# Patient Record
Sex: Male | Born: 1980 | Race: Asian | Hispanic: No | Marital: Single | Smoking: Never smoker
Health system: Southern US, Community
[De-identification: ages and names within clinical notes are randomized; demographics above are authoritative.]

---

## 2008-01-10 ENCOUNTER — Emergency Department (HOSPITAL_COMMUNITY): Admission: EM | Admit: 2008-01-10 | Discharge: 2008-01-10 | Payer: Self-pay | Admitting: Family Medicine

## 2012-07-07 ENCOUNTER — Ambulatory Visit: Payer: Self-pay | Admitting: Medical

## 2012-09-30 ENCOUNTER — Ambulatory Visit (INDEPENDENT_AMBULATORY_CARE_PROVIDER_SITE_OTHER): Payer: BC Managed Care – HMO | Admitting: Family Medicine

## 2012-09-30 VITALS — BP 112/82 | HR 72 | Ht 63.2 in | Wt 152.0 lb

## 2012-09-30 DIAGNOSIS — M25562 Pain in left knee: Secondary | ICD-10-CM

## 2012-09-30 DIAGNOSIS — M25569 Pain in unspecified knee: Secondary | ICD-10-CM

## 2012-09-30 NOTE — Patient Instructions (Signed)
Take 2 Aleve twice a day for the next 2 weeks. If the pain goes away we don't need to do anything else. If it continues then I want you to come back for reevaluation

## 2012-09-30 NOTE — Progress Notes (Signed)
  Subjective:    Patient ID: Edward Hatfield, male    DOB: 1980-11-05, 32 y.o.   MRN: [id number]  HPI 2 weeks ago he noted the onset of left knee pain and inability to fully flex his knee. No popping, swelling, locking or grinding. No history of injury. No other joints are involved.  Review of Systems     Objective:   Physical Exam Exam of the left knee shows no palpable tenderness, effusion. Full motion. No joint line tenderness. Negative anterior drawer. McMurray's testing medial cause slight discomfort.       Assessment & Plan:  Left knee pain  recommend conservative care with 2 Aleve twice per day for the next several weeks. If he has no relief of the symptoms, history turned here. He voiced understanding.

## 2015-07-18 ENCOUNTER — Emergency Department (HOSPITAL_COMMUNITY)
Admission: EM | Admit: 2015-07-18 | Discharge: 2015-07-19 | Disposition: A | Discharge status: Home / Self Care | Payer: 59 | Attending: Emergency Medicine | Admitting: Emergency Medicine

## 2015-07-18 ENCOUNTER — Encounter (HOSPITAL_COMMUNITY): Payer: Self-pay | Admitting: Emergency Medicine

## 2015-07-18 DIAGNOSIS — Y999 Unspecified external cause status: Secondary | ICD-10-CM | POA: Insufficient documentation

## 2015-07-18 DIAGNOSIS — S161XXA Strain of muscle, fascia and tendon at neck level, initial encounter: Secondary | ICD-10-CM | POA: Insufficient documentation

## 2015-07-18 DIAGNOSIS — Y9389 Activity, other specified: Secondary | ICD-10-CM | POA: Diagnosis not present

## 2015-07-18 DIAGNOSIS — Y92410 Unspecified street and highway as the place of occurrence of the external cause: Secondary | ICD-10-CM | POA: Diagnosis not present

## 2015-07-18 DIAGNOSIS — S20219A Contusion of unspecified front wall of thorax, initial encounter: Secondary | ICD-10-CM | POA: Insufficient documentation

## 2015-07-18 DIAGNOSIS — S199XXA Unspecified injury of neck, initial encounter: Secondary | ICD-10-CM | POA: Diagnosis present
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
## 2015-07-19 ENCOUNTER — Emergency Department (HOSPITAL_COMMUNITY): Payer: 59

## 2015-07-19 DIAGNOSIS — S161XXA Strain of muscle, fascia and tendon at neck level, initial encounter: Secondary | ICD-10-CM | POA: Diagnosis not present

## 2015-07-19 MED ORDER — CYCLOBENZAPRINE HCL 10 MG PO TABS: 10.0000 mg | ORAL_TABLET | Freq: Three times a day (TID) | ORAL | Status: AC | PRN

## 2015-07-19 MED ORDER — IBUPROFEN 800 MG PO TABS: 800.0000 mg | ORAL_TABLET | Freq: Three times a day (TID) | ORAL | Status: AC

## 2015-07-19 NOTE — Discharge Instructions (Signed)
Cervical Sprain °A cervical sprain is an injury in the neck in which the strong, fibrous tissues (ligaments) that connect your neck bones stretch or tear. Cervical sprains can range from mild to severe. Severe cervical sprains can cause the neck vertebrae to be unstable. This can lead to damage of the spinal cord and can result in serious nervous system problems. The amount of time it takes for a cervical sprain to get better depends on the cause and extent of the injury. Most cervical sprains heal in 1 to 3 weeks. °CAUSES  °Severe cervical sprains may be caused by:  °· Contact sport injuries (such as from football, rugby, wrestling, hockey, auto racing, gymnastics, diving, martial arts, or boxing).   °· Motor vehicle collisions.   °· Whiplash injuries. This is an injury from a sudden forward and backward whipping movement of the head and neck.  °· Falls.   °Mild cervical sprains may be caused by:  °· Being in an awkward position, such as while cradling a telephone between your ear and shoulder.   °· Sitting in a chair that does not offer proper support.   °· Working at a poorly designed computer station.   °· Looking up or down for long periods of time.   °SYMPTOMS  °· Pain, soreness, stiffness, or a burning sensation in the front, back, or sides of the neck. This discomfort may develop immediately after the injury or slowly, 24 hours or more after the injury.   °· Pain or tenderness directly in the middle of the back of the neck.   °· Shoulder or upper back pain.   °· Limited ability to move the neck.   °· Headache.   °· Dizziness.   °· Weakness, numbness, or tingling in the hands or arms.   °· Muscle spasms.   °· Difficulty swallowing or chewing.   °· Tenderness and swelling of the neck.   °DIAGNOSIS  °Most of the time your health care provider can diagnose a cervical sprain by taking your history and doing a physical exam. Your health care provider will ask about previous neck injuries and any known neck  problems, such as arthritis in the neck. X-rays may be taken to find out if there are any other problems, such as with the bones of the neck. Other tests, such as a CT scan or MRI, may also be needed.  °TREATMENT  °Treatment depends on the severity of the cervical sprain. Mild sprains can be treated with rest, keeping the neck in place (immobilization), and pain medicines. Severe cervical sprains are immediately immobilized. Further treatment is done to help with pain, muscle spasms, and other symptoms and may include: °· Medicines, such as pain relievers, numbing medicines, or muscle relaxants.   °· Physical therapy. This may involve stretching exercises, strengthening exercises, and posture training. Exercises and improved posture can help stabilize the neck, strengthen muscles, and help stop symptoms from returning.   °HOME CARE INSTRUCTIONS  °· Put ice on the injured area.   °¨ Put ice in a plastic bag.   °¨ Place a towel between your skin and the bag.   °¨ Leave the ice on for 15-20 minutes, 3-4 times a day.   °· If your injury was severe, you may have been given a cervical collar to wear. A cervical collar is a two-piece collar designed to keep your neck from moving while it heals. °¨ Do not remove the collar unless instructed by your health care provider. °¨ If you have long hair, keep it outside of the collar. °¨ Ask your health care provider before making any adjustments to your collar. Minor   adjustments may be required over time to improve comfort and reduce pressure on your chin or on the back of your head. °¨ If you are allowed to remove the collar for cleaning or bathing, follow your health care provider's instructions on how to do so safely. °¨ Keep your collar clean by wiping it with mild soap and water and drying it completely. If the collar you have been given includes removable pads, remove them every 1-2 days and hand wash them with soap and water. Allow them to air dry. They should be completely  dry before you wear them in the collar. °¨ If you are allowed to remove the collar for cleaning and bathing, wash and dry the skin of your neck. Check your skin for irritation or sores. If you see any, tell your health care provider. °¨ Do not drive while wearing the collar.   °· Only take over-the-counter or prescription medicines for pain, discomfort, or fever as directed by your health care provider.   °· Keep all follow-up appointments as directed by your health care provider.   °· Keep all physical therapy appointments as directed by your health care provider.   °· Make any needed adjustments to your workstation to promote good posture.   °· Avoid positions and activities that make your symptoms worse.   °· Warm up and stretch before being active to help prevent problems.   °SEEK MEDICAL CARE IF:  °· Your pain is not controlled with medicine.   °· You are unable to decrease your pain medicine over time as planned.   °· Your activity level is not improving as expected.   °SEEK IMMEDIATE MEDICAL CARE IF:  °· You develop any bleeding. °· You develop stomach upset. °· You have signs of an allergic reaction to your medicine.   °· Your symptoms get worse.   °· You develop new, unexplained symptoms.   °· You have numbness, tingling, weakness, or paralysis in any part of your body.   °MAKE SURE YOU:  °· Understand these instructions. °· Will watch your condition. °· Will get help right away if you are not doing well or get worse. °  °This information is not intended to replace advice given to you by your health care provider. Make sure you discuss any questions you have with your health care provider. °  °Document Released: 10/29/2006 Document Revised: 01/06/2013 Document Reviewed: 07/09/2012 °Elsevier Interactive Patient Education ©2016 [employer] ° °Chest Contusion °A chest contusion is a deep bruise on your chest area. Contusions are the result of an injury that caused bleeding under the skin. A chest contusion  may involve bruising of the skin, muscles, or ribs. The contusion may turn blue, purple, or yellow. Minor injuries will give you a painless contusion, but more severe contusions may stay painful and swollen for a few weeks. °CAUSES  °A contusion is usually caused by a blow, trauma, or direct force to an area of the body. °SYMPTOMS  °· Swelling and redness of the injured area. °· Discoloration of the injured area. °· Tenderness and soreness of the injured area. °· Pain. °DIAGNOSIS  °The diagnosis can be made by taking a history and performing a physical exam. An X-ray, CT scan, or MRI may be needed to determine if there were any associated injuries, such as broken bones (fractures) or internal injuries. °TREATMENT  °Often, the best treatment for a chest contusion is resting, icing, and applying cold compresses to the injured area. Deep breathing exercises may be recommended to reduce the risk of pneumonia. Over-the-counter medicines may also be recommended   for pain control. °HOME CARE INSTRUCTIONS  °· Put ice on the injured area. °¨ Put ice in a plastic bag. °¨ Place a towel between your skin and the bag. °¨ Leave the ice on for 15-20 minutes, 03-04 times a day. °· Only take over-the-counter or prescription medicines as directed by your caregiver. Your caregiver may recommend avoiding anti-inflammatory medicines (aspirin, ibuprofen, and naproxen) for 48 hours because these medicines may increase bruising. °· Rest the injured area. °· Perform deep-breathing exercises as directed by your caregiver. °· Stop smoking if you smoke. °· Do not lift objects over 5 pounds (2.3 kg) for 3 days or longer if recommended by your caregiver. °SEEK IMMEDIATE MEDICAL CARE IF:  °· You have increased bruising or swelling. °· You have pain that is getting worse. °· You have difficulty breathing. °· You have dizziness, weakness, or fainting. °· You have blood in your urine or stool. °· You cough up or vomit blood. °· Your swelling or pain  is not relieved with medicines. °MAKE SURE YOU:  °· Understand these instructions. °· Will watch your condition. °· Will get help right away if you are not doing well or get worse. °  °This information is not intended to replace advice given to you by your health care provider. Make sure you discuss any questions you have with your health care provider. °  °Document Released: 09/26/2000 Document Revised: 09/26/2011 Document Reviewed: 06/25/2011 °Elsevier Interactive Patient Education ©2016 [employer] ° °

## 2017-02-06 DIAGNOSIS — E7849 Other hyperlipidemia: Secondary | ICD-10-CM | POA: Diagnosis not present

## 2017-02-06 DIAGNOSIS — J302 Other seasonal allergic rhinitis: Secondary | ICD-10-CM | POA: Diagnosis not present

## 2017-02-06 DIAGNOSIS — L508 Other urticaria: Secondary | ICD-10-CM | POA: Diagnosis not present

## 2017-02-13 DIAGNOSIS — J302 Other seasonal allergic rhinitis: Secondary | ICD-10-CM | POA: Diagnosis not present

## 2017-02-13 DIAGNOSIS — E7849 Other hyperlipidemia: Secondary | ICD-10-CM | POA: Diagnosis not present

## 2017-02-13 DIAGNOSIS — Z0000 Encounter for general adult medical examination without abnormal findings: Secondary | ICD-10-CM | POA: Diagnosis not present

## 2017-02-13 DIAGNOSIS — Z131 Encounter for screening for diabetes mellitus: Secondary | ICD-10-CM | POA: Diagnosis not present

## 2017-02-13 DIAGNOSIS — L508 Other urticaria: Secondary | ICD-10-CM | POA: Diagnosis not present
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
## 2018-08-02 DIAGNOSIS — Z20828 Contact with and (suspected) exposure to other viral communicable diseases: Secondary | ICD-10-CM | POA: Diagnosis not present

## 2018-09-08 DIAGNOSIS — Z20828 Contact with and (suspected) exposure to other viral communicable diseases: Secondary | ICD-10-CM | POA: Diagnosis not present
